# Patient Record
Sex: Female | Born: 2000 | Race: White | Hispanic: No | Marital: Single | Smoking: Never smoker
Health system: Southern US, Community
[De-identification: ages and names within clinical notes are randomized; demographics above are authoritative.]

## PROBLEM LIST (undated history)

## (undated) DIAGNOSIS — G4700 Insomnia, unspecified: Secondary | ICD-10-CM

## (undated) DIAGNOSIS — L02433 Carbuncle of right upper limb: Secondary | ICD-10-CM

## (undated) DIAGNOSIS — F988 Other specified behavioral and emotional disorders with onset usually occurring in childhood and adolescence: Secondary | ICD-10-CM

## (undated) DIAGNOSIS — F913 Oppositional defiant disorder: Secondary | ICD-10-CM

## (undated) HISTORY — DX: Insomnia, unspecified: G47.00

## (undated) HISTORY — PX: OTHER SURGICAL HISTORY: SHX169

## (undated) HISTORY — PX: TYMPANOSTOMY TUBE PLACEMENT: SHX32

## (undated) HISTORY — DX: Oppositional defiant disorder: F91.3

## (undated) HISTORY — DX: Carbuncle of right upper limb: L02.433

## (undated) HISTORY — DX: Other specified behavioral and emotional disorders with onset usually occurring in childhood and adolescence: F98.8

---
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
## 2022-01-16 DIAGNOSIS — Z01419 Encounter for gynecological examination (general) (routine) without abnormal findings: Secondary | ICD-10-CM | POA: Diagnosis not present

## 2022-01-16 DIAGNOSIS — N898 Other specified noninflammatory disorders of vagina: Secondary | ICD-10-CM | POA: Diagnosis not present

## 2022-01-16 DIAGNOSIS — N946 Dysmenorrhea, unspecified: Secondary | ICD-10-CM | POA: Diagnosis not present

## 2022-01-16 DIAGNOSIS — Z113 Encounter for screening for infections with a predominantly sexual mode of transmission: Secondary | ICD-10-CM | POA: Diagnosis not present

## 2022-01-24 DIAGNOSIS — N946 Dysmenorrhea, unspecified: Secondary | ICD-10-CM | POA: Diagnosis not present

## 2022-01-30 DIAGNOSIS — Z13220 Encounter for screening for lipoid disorders: Secondary | ICD-10-CM | POA: Diagnosis not present

## 2022-01-30 DIAGNOSIS — Z130 Encounter for screening for diseases of the blood and blood-forming organs and certain disorders involving the immune mechanism: Secondary | ICD-10-CM | POA: Diagnosis not present

## 2022-01-30 DIAGNOSIS — F902 Attention-deficit hyperactivity disorder, combined type: Secondary | ICD-10-CM | POA: Diagnosis not present

## 2022-01-30 DIAGNOSIS — J069 Acute upper respiratory infection, unspecified: Secondary | ICD-10-CM | POA: Diagnosis not present

## 2022-01-30 DIAGNOSIS — Z0000 Encounter for general adult medical examination without abnormal findings: Secondary | ICD-10-CM | POA: Diagnosis not present

## 2022-01-30 DIAGNOSIS — F419 Anxiety disorder, unspecified: Secondary | ICD-10-CM | POA: Diagnosis not present

## 2022-01-30 DIAGNOSIS — N946 Dysmenorrhea, unspecified: Secondary | ICD-10-CM | POA: Diagnosis not present

## 2022-01-30 DIAGNOSIS — Z131 Encounter for screening for diabetes mellitus: Secondary | ICD-10-CM | POA: Diagnosis not present

## 2022-07-31 DIAGNOSIS — H6692 Otitis media, unspecified, left ear: Secondary | ICD-10-CM | POA: Diagnosis not present

## 2022-07-31 DIAGNOSIS — F902 Attention-deficit hyperactivity disorder, combined type: Secondary | ICD-10-CM | POA: Diagnosis not present
